# Patient Record
Sex: Female | Born: 1935 | Race: White | Hispanic: No | Marital: Married
Health system: Midwestern US, Academic
[De-identification: ages and names within clinical notes are randomized; demographics above are authoritative.]

---

## 2017-01-20 MED ORDER — VERAPAMIL 240 MG PO TBER
ORAL_TABLET | ORAL | 3 refills | 90.00000 days | Status: DC
Start: 2017-01-20 — End: 2017-01-20

## 2017-01-20 MED ORDER — VERAPAMIL 240 MG PO TBER
ORAL_TABLET | Freq: Two times a day (BID) | ORAL | 2 refills | 90.00000 days | Status: DC
Start: 2017-01-20 — End: 2017-11-23

## 2017-05-10 ENCOUNTER — Encounter: Admit: 2017-05-10 | Discharge: 2017-05-10 | Payer: MEDICARE

## 2017-05-10 DIAGNOSIS — E785 Hyperlipidemia, unspecified: ICD-10-CM

## 2017-05-10 DIAGNOSIS — I10 Essential (primary) hypertension: Principal | ICD-10-CM

## 2017-05-23 ENCOUNTER — Encounter: Admit: 2017-05-23 | Discharge: 2017-05-23 | Payer: MEDICARE

## 2017-05-23 DIAGNOSIS — I10 Essential (primary) hypertension: Principal | ICD-10-CM

## 2017-05-23 DIAGNOSIS — E785 Hyperlipidemia, unspecified: ICD-10-CM

## 2017-05-23 LAB — LIPID PROFILE
Lab: 200 — ABNORMAL LOW (ref 98–107)
Lab: 254 — ABNORMAL HIGH (ref 30–200)

## 2017-05-23 LAB — ALT (SGPT): Lab: 19

## 2017-05-23 LAB — BASIC METABOLIC PANEL: Lab: 130

## 2017-05-23 NOTE — Telephone Encounter
Left message with results and Dr. Wesley Blas recommendations.  Left callback number for any questions or concerns.  Pt is scheduled to see Dr. Arna Medici in [place] on 10/4.

## 2017-05-23 NOTE — Telephone Encounter
-----   Message from Hester Mates, MD sent at 05/23/2017 12:11 PM CDT -----  Franchot Mimes: Recent labs look good except for elevated triglycerides.  She should restrict her carbohydrates and increase her activity.  Please let her know.  Thanks. SBG  ----- Message -----  From: Rogelia Boga, RN  Sent: 05/23/2017  11:08 AM  To: Hester Mates, MD    Labs for your review, pt has a follow up appointment scheduled on 10/4 with you.  Please let the [facility] nursing know if you have any recommendations.  Thank you!

## 2017-05-23 NOTE — Telephone Encounter
-----   Message from Steven B Gollub, MD sent at 05/23/2017 12:11 PM CDT -----  Steve and Lisa: Recent labs look good except for elevated triglycerides.  She should restrict her carbohydrates and increase her activity.  Please let her know.  Thanks. SBG  ----- Message -----  From: Campbell, Jamie, RN  Sent: 05/23/2017  11:08 AM  To: Steven B Gollub, MD    Labs for your review, pt has a follow up appointment scheduled on 10/4 with you.  Please let the [facility] nursing know if you have any recommendations.  Thank you!

## 2017-05-26 ENCOUNTER — Ambulatory Visit: Admit: 2017-05-26 | Discharge: 2017-05-27 | Payer: MEDICARE

## 2017-05-26 ENCOUNTER — Encounter: Admit: 2017-05-26 | Discharge: 2017-05-26 | Payer: MEDICARE

## 2017-05-26 DIAGNOSIS — I10 Essential (primary) hypertension: Principal | ICD-10-CM

## 2017-05-26 DIAGNOSIS — E7800 Pure hypercholesterolemia, unspecified: ICD-10-CM

## 2017-05-26 DIAGNOSIS — R002 Palpitations: Principal | ICD-10-CM

## 2017-05-26 DIAGNOSIS — E785 Hyperlipidemia, unspecified: ICD-10-CM

## 2017-05-26 DIAGNOSIS — I471 Supraventricular tachycardia: ICD-10-CM

## 2017-05-26 NOTE — Progress Notes
Date of Service: 05/26/2017    Emily Frey is a 81 y.o. female.       HPI     Ms Emily Frey is followed for hypertension, supraventricular tachycardia and for hyperlipidemia.  She reports recurrent episode of sinusitis in March 2018 which responded to antibiotic therapy.  Ms. Emily Frey reports only 3 minor episodes of palpitations over the past year.  The longest episode lasted approximately 90 seconds.  He is mildly lightheaded at the time but not presyncopal or syncopal. Otherwise, the patient has been doing well and reports no angina, congestive symptoms, or syncope.  Her size, the patient is currently swimming for an hour 3 times a week.  The patient reports no myalgias, bleeding abnormalities, neurologic motor abnormalities or difficulty with speech. Ms. Emily Frey reports that her blood pressure has been very well controlled when she checks it outside of the office and she is presently taking her hydralazine 2 times a day.  Historically, Ms. Emily Frey has had some difficulty tolerating antihypertensive medications in the past. She had been on Toprol XL in the past which caused unacceptable fatigue and she had been on Avapro which made her arms feel cold. Micardis also made her feel cold and she reported that Cozaar gave her a swollen upper lip. Most recently she has been on a combination of hydralazine, verapamil, and triamterene with hydrochlorothiazide as a diuretic. She has tolerated her present antihypertensive regimen the best and it has been fairly effective in controlling her blood pressure.       Vitals:    05/26/17 1514 05/26/17 1529   BP: 136/72 144/68   Pulse: 57    Weight: 74.1 kg (163 lb 6.4 oz)    Height: 1.626 m (5' 4)      Body mass index is 28.05 kg/m???.     Past Medical History  Patient Active Problem List    Diagnosis Date Noted   ??? Hypertension, goal below 140/90 05/06/2009   ??? SVT (supraventricular tachycardia) (HCC) 05/06/2009   ??? Hyperlipidemia 05/06/2009         Review of Systems Constitution: Negative.   HENT: Positive for tinnitus.    Eyes: Negative.    Cardiovascular: Positive for irregular heartbeat.   Respiratory: Negative.    Endocrine: Negative.    Hematologic/Lymphatic: Negative.    Skin: Negative.    Musculoskeletal: Negative.    Gastrointestinal: Negative.    Genitourinary: Negative.    Neurological: Negative.    Psychiatric/Behavioral: Negative.    Allergic/Immunologic: Negative.        Physical Exam  GENERAL: The patient is well developed, well nourished, resting comfortably and in no distress.   HEENT: No abnormalities of the visible oro-nasopharynx, conjunctiva or sclera are noted.  NECK: There is no jugular venous distension. Carotids are palpable and without bruits. There is no thyroid enlargement.  Chest: Lung fields are clear to auscultation. There are no wheezes or crackles.  CV: There is a regular rhythm. The first and second heart sounds are normal. There are no murmurs, gallops or rubs. Her apical heart rate is 60 BPM.  ABD: The abdomen is soft and supple with normal bowel sounds. There is no hepatosplenomegaly, ascites, tenderness, masses or bruits.  Neuro: There are no focal motor defects. Ambulation is normal. Cognitive function appears normal.  Ext:???There is no edema or evidence of deep vein thrombosis. Peripheral pulses are satisfactory. ???  SKIN:???There are no rashes and no cellulitis  PSYCH:???The patient is calm, rationale and oriented  Cardiovascular Studies  Twelve-lead ECG was obtained on 05/26/2017 reveals normal sinus rhythm with a heart rate of 57 bpm.  There is no evidence of myocardial ischemia or infarction.    Labs from 05/23/2017 revealed total cholesterol 200, triglycerides 254, HDL 61 and LDL cholesterol 97 mg/dL.  Her ALT was 19.  Her serum creatinine was 0.72 mg/dL and serum potassium was 3.7 mmol/L.  Problems Addressed Today  Hypertension.  Palpitations. hypercholesterolemia.  Assessment and Plan Ms. Emily Frey???is doing well from a cardiovascular perspective. Ms. Emily Frey again declined the use of statin medication because she says that she is sensitive to the side effects of medications.  Yesterday increase her hydralazine to 25 mg 3 times daily.  I have asked the patient to keep a log book of her BP readings and to report systolic BP readings exceeding 140 mm Hg. Regular mild, aerobic exercise, weight loss and adherence to a heart healthy diet were recommended.  I have asked her to return for follow-up in 12 months.         Current Medications (including today's revisions)  ??? Aspirin 81 mg PO Tab Take 1 Tab by mouth Daily.   ??? CALCIUM CARBONATE/VITAMIN D3 (CALCIUM + D PO) Take 600 mg by mouth daily.   ??? fish oil /omega-3 fatty acids (SEA-OMEGA) 340/1000 mg PO Cap Take 1,000 mg by mouth daily.   ??? hydrALAZINE (APRESOLINE) 25 mg tablet TAKE 1 TAB BY MOUTH FOUR TIMES DAILY. (Patient taking differently: TAKE 1 TAB BY MOUTH TWO TIMES DAILY.)   ??? Magnesium 250 mg tab Take 1 tablet by mouth daily.   ??? Potassium 99 mg tab Take 1 tablet by mouth daily.   ??? PV W-O CAL/FERROUS FUMARATE/FA (M-VIT PO) Take 1 Tab by mouth Daily.   ??? triamterene-hydrochlorothiazide (DYAZIDE) 37.5-25 mg capsule TAKE ONE CAPSULE BY MOUTH IN THE MORNING   ??? verapamil  SR (CALAN-SR) 240 mg tablet Take one tablet twice daily.

## 2017-10-03 ENCOUNTER — Encounter: Admit: 2017-10-03 | Discharge: 2017-10-03 | Payer: MEDICARE

## 2017-10-03 MED ORDER — TRIAMTERENE-HYDROCHLOROTHIAZID 37.5-25 MG PO CAP
ORAL_CAPSULE | ORAL | 3 refills | Status: AC
Start: 2017-10-03 — End: 2019-01-17

## 2017-10-17 ENCOUNTER — Encounter: Admit: 2017-10-17 | Discharge: 2017-10-17 | Payer: MEDICARE

## 2017-10-17 MED ORDER — HYDRALAZINE 25 MG PO TAB
25 mg | ORAL_TABLET | Freq: Four times a day (QID) | ORAL | 2 refills | 30.00000 days | Status: AC
Start: 2017-10-17 — End: 2018-09-21

## 2017-10-24 LAB — BASIC METABOLIC PANEL
Lab: 0.7
Lab: 10
Lab: 10
Lab: 102 — ABNORMAL HIGH (ref 130–400)
Lab: 16 — ABNORMAL HIGH (ref 0–14)
Lab: 23
Lab: 83 — ABNORMAL LOW (ref 11.5–14.5)

## 2017-11-23 ENCOUNTER — Encounter: Admit: 2017-11-23 | Discharge: 2017-11-23 | Payer: MEDICARE

## 2017-11-23 MED ORDER — VERAPAMIL 240 MG PO TBER
ORAL_TABLET | Freq: Two times a day (BID) | ORAL | 2 refills | 90.00000 days | Status: AC
Start: 2017-11-23 — End: 2018-08-02

## 2018-08-02 ENCOUNTER — Encounter: Admit: 2018-08-02 | Discharge: 2018-08-02 | Payer: MEDICARE

## 2018-08-02 MED ORDER — VERAPAMIL 240 MG PO TBER
ORAL_TABLET | Freq: Two times a day (BID) | ORAL | 0 refills | 90.00000 days | Status: AC
Start: 2018-08-02 — End: 2018-10-30

## 2018-09-21 ENCOUNTER — Encounter: Admit: 2018-09-21 | Discharge: 2018-09-21 | Payer: MEDICARE

## 2018-09-21 ENCOUNTER — Ambulatory Visit: Admit: 2018-09-21 | Discharge: 2018-09-21 | Payer: MEDICARE

## 2018-09-21 DIAGNOSIS — I10 Essential (primary) hypertension: Secondary | ICD-10-CM

## 2018-09-21 DIAGNOSIS — I471 Supraventricular tachycardia: Secondary | ICD-10-CM

## 2018-09-21 DIAGNOSIS — E785 Hyperlipidemia, unspecified: Secondary | ICD-10-CM

## 2018-09-21 LAB — BASIC METABOLIC PANEL
Lab: 0.7
Lab: 13
Lab: 132 — ABNORMAL LOW (ref 136–145)
Lab: 14
Lab: 26
Lab: 4
Lab: 89
Lab: 9.3
Lab: 97 — ABNORMAL LOW (ref 98–107)

## 2018-09-21 MED ORDER — HYDRALAZINE 25 MG PO TAB
25 mg | ORAL_TABLET | Freq: Three times a day (TID) | ORAL | 3 refills | 42.50000 days | Status: AC
Start: 2018-09-21 — End: 2019-10-04

## 2018-09-22 ENCOUNTER — Encounter: Admit: 2018-09-22 | Discharge: 2018-09-22 | Payer: MEDICARE

## 2018-09-22 DIAGNOSIS — I10 Essential (primary) hypertension: Secondary | ICD-10-CM

## 2018-10-29 ENCOUNTER — Encounter: Admit: 2018-10-29 | Discharge: 2018-10-29 | Payer: MEDICARE

## 2018-10-30 MED ORDER — VERAPAMIL 240 MG PO TBER
ORAL_TABLET | Freq: Two times a day (BID) | ORAL | 3 refills | 90.00000 days | Status: AC
Start: 2018-10-30 — End: 2019-11-06

## 2019-01-17 ENCOUNTER — Encounter: Admit: 2019-01-17 | Discharge: 2019-01-17 | Payer: MEDICARE

## 2019-01-17 DIAGNOSIS — I10 Essential (primary) hypertension: Principal | ICD-10-CM

## 2019-01-17 MED ORDER — TRIAMTERENE-HYDROCHLOROTHIAZID 37.5-25 MG PO CAP
1 | ORAL_CAPSULE | Freq: Every morning | ORAL | 3 refills | Status: DC
Start: 2019-01-17 — End: 2020-01-01

## 2019-10-04 ENCOUNTER — Encounter: Admit: 2019-10-04 | Discharge: 2019-10-04 | Payer: MEDICARE

## 2019-10-04 DIAGNOSIS — I10 Essential (primary) hypertension: Secondary | ICD-10-CM

## 2019-10-04 MED ORDER — HYDRALAZINE 25 MG PO TAB
25 mg | ORAL_TABLET | Freq: Three times a day (TID) | ORAL | 0 refills | 30.00000 days | Status: DC
Start: 2019-10-04 — End: 2020-01-01

## 2019-10-04 NOTE — Telephone Encounter
Received a request via computer from the patients pharmacy requesting a refill. Script e-scribed as requested.      Overdue for office visit, will ask scheduling to call.

## 2019-11-06 ENCOUNTER — Encounter: Admit: 2019-11-06 | Discharge: 2019-11-06 | Payer: MEDICARE

## 2019-11-06 MED ORDER — VERAPAMIL 240 MG PO TBER
ORAL_TABLET | Freq: Two times a day (BID) | ORAL | 3 refills | 90.00000 days | Status: DC
Start: 2019-11-06 — End: 2020-01-01

## 2020-01-01 ENCOUNTER — Encounter: Admit: 2020-01-01 | Discharge: 2020-01-01 | Payer: MEDICARE

## 2020-01-01 DIAGNOSIS — E785 Hyperlipidemia, unspecified: Secondary | ICD-10-CM

## 2020-01-01 DIAGNOSIS — I10 Essential (primary) hypertension: Secondary | ICD-10-CM

## 2020-01-01 DIAGNOSIS — I471 Supraventricular tachycardia: Secondary | ICD-10-CM

## 2020-01-01 LAB — BASIC METABOLIC PANEL
Lab: 0.8 g/dL (ref 32.0–36.0)
Lab: 101 % — ABNORMAL LOW (ref 36–45)
Lab: 13 % — ABNORMAL LOW (ref 24–44)
Lab: 133 M/UL — ABNORMAL LOW (ref 136–145)
Lab: 18 pg (ref 26–34)
Lab: 23 FL (ref 80–100)
Lab: 4 g/dL — ABNORMAL LOW (ref 12.0–15.0)
Lab: 96 % — ABNORMAL HIGH (ref 11–15)

## 2020-01-01 MED ORDER — TRIAMTERENE-HYDROCHLOROTHIAZID 37.5-25 MG PO CAP
1 | ORAL_CAPSULE | Freq: Every morning | ORAL | 3 refills | Status: AC
Start: 2020-01-01 — End: ?

## 2020-01-01 MED ORDER — VERAPAMIL 240 MG PO TBER
240 mg | ORAL_TABLET | Freq: Two times a day (BID) | ORAL | 3 refills | 90.00000 days | Status: AC
Start: 2020-01-01 — End: ?

## 2020-01-01 MED ORDER — HYDRALAZINE 50 MG PO TAB
50 mg | ORAL_TABLET | Freq: Three times a day (TID) | ORAL | 3 refills | 42.50000 days | Status: AC
Start: 2020-01-01 — End: ?

## 2020-01-01 NOTE — Progress Notes
Date of Service: 01/01/2020    KEYLIANIS TAKARA is a 84 y.o. female.       HPI     Ms Jessel is followed for hypertension, supraventricular tachycardia and for hyperlipidemia.  She reports no febrile or infectious symptoms during the Covid pandemic?and has not yet received her Covid vaccines.  She had viral gastroenteritis early in 2020 which was not Covid.  The patient states that she developed lethargy in March 2019 and lab testing revealed mild hyponatremia.  Her triamterene was stopped for a period of time and she increased her salt intake and felt better.  She has since restarted her triamterene.  However, her systolic blood pressure still runs somewhat elevated when she checks it at home often with systolic blood pressures in the range of 140 to 150 mmHg.  She is polite but is adverse to lowering her blood pressure further because it makes her feel poorly. She is presently taking her hydralazine?25 mg 3?times a day.  Her palpitations have been under good control over the past year.  Otherwise, the patient has been doing well and reports no angina, congestive symptoms, palpitations, sensation of sustained forceful heart beating, lightheadedness or syncope.? She tries to walk at a slow pace for 20 minutes when the weather is nice and does stretching exercises.  Subjectively her biggest problem is chronic low back discomfort. ?The patient reports no myalgias, bleeding abnormalities, or strokelike symptoms historically, Ms. Baumel has had some difficulty tolerating antihypertensive medications in the past. She had been on Toprol XL in the past which caused unacceptable fatigue and she had been on Avapro which made her arms feel cold. Micardis also made her feel cold and she reported that Cozaar gave her a swollen upper lip. Most recently she has been on a combination of hydralazine, verapamil, and triamterene with hydrochlorothiazide as a diuretic. She has tolerated her present antihypertensive regimen the best. Vitals:    01/01/20 0825 01/01/20 0834   BP: (!) 158/74 (!) 156/66   BP Source: Arm, Left Upper Arm, Right Upper   Patient Position: Sitting Sitting   Pulse: 80    SpO2: 97%    Weight: 75.6 kg (166 lb 9.6 oz)    Height: 1.626 m (5' 4)    PainSc: Zero      Body mass index is 28.6 kg/m?Marland Kitchen     Past Medical History  Patient Active Problem List    Diagnosis Date Noted   ? Hypertension, goal below 140/90 05/06/2009   ? SVT (supraventricular tachycardia) (HCC) 05/06/2009   ? Hyperlipidemia 05/06/2009         Review of Systems   Constitution: Negative.   HENT: Positive for congestion and tinnitus.    Eyes: Negative.    Cardiovascular: Positive for palpitations.   Respiratory: Positive for cough and sputum production.    Endocrine: Negative.    Hematologic/Lymphatic: Bruises/bleeds easily.   Skin: Negative.    Musculoskeletal: Positive for back pain and joint pain.   Gastrointestinal: Negative.    Genitourinary: Negative.    Neurological: Positive for headaches and paresthesias.   Psychiatric/Behavioral: Negative.    Allergic/Immunologic: Negative.        Physical Exam  GENERAL: The patient is well developed, well nourished, resting comfortably and in no distress.   HEENT: No abnormalities of the visible oro-nasopharynx, conjunctiva or sclera are noted.  NECK: There is no jugular venous distension. Carotids are palpable and without bruits. There is no thyroid enlargement.  Chest: Lung fields are clear  to auscultation. There are no wheezes or crackles.  CV: There is a regular rhythm. The first and second heart sounds are normal. There are no murmurs, gallops or rubs. Her apical heart rate is?72?BPM.  ABD: The abdomen is soft and supple with normal bowel sounds. There is no hepatosplenomegaly, ascites, tenderness, masses or bruits.  Neuro: There are no focal motor defects. Ambulation is normal. Cognitive function appears normal.  Ext:?There is trace bipedal edema without evidence of deep vein thrombosis. Peripheral pulses are satisfactory. ?  SKIN:?There are no rashes and no cellulitis  PSYCH:?The patient is calm, rationale and oriented    Cardiovascular Studies  A twelve-lead ECG was obtained on 01/01/2020 reveals normal sinus rhythm with a heart rate of 72 bpm.  Incomplete right bundle branch block is noted.    Problems Addressed Today  Hypertension.  Hypercholesterolemia.  Supraventricular tachycardia.  Assessment and Plan     Ms. Pett reports that her palpitations are under satisfactory control.  She is very resistant to increasing her antihypertensive medications but after great discussion she was willing to increase her hydralazine to 50 mg 3 times daily. I have asked the patient to keep a log book of his BP readings and to report BP readings exceeding 130/80 mm Hg.  Regular mild aerobic exercise, weight loss and adherence to a heart healthy diet were recommended.  I offered to refer her to the spine center at [facility] but she declined.  I have asked her to return for follow-up in approximately 6 months time to review her blood pressure readings.         Current Medications (including today's revisions)  ? ascorbic acid (VITAMIN C) 500 mg tablet Take 500 mg by mouth daily.   ? Aspirin 81 mg PO Tab Take 1 Tab by mouth Daily.   ? CALCIUM CARBONATE/VITAMIN D3 (CALCIUM + D PO) Take 600 mg by mouth daily.   ? cholecalciferol (VITAMIN D) 1,000 units tablet Take 3,000 Units by mouth daily.   ? fish oil /omega-3 fatty acids (SEA-OMEGA) 340/1000 mg PO Cap Take 1,000 mg by mouth daily.   ? hydrALAZINE (APRESOLINE) 50 mg tablet Take one tablet by mouth three times daily.   ? Magnesium 250 mg tab Take 1 tablet by mouth daily.   ? Potassium 99 mg tab Take 1 tablet by mouth daily.   ? PV W-O CAL/FERROUS FUMARATE/FA (M-VIT PO) Take 1 Tab by mouth Daily.   ? triamterene-hydrochlorothiazide (DYAZIDE) 37.5-25 mg capsule Take one capsule by mouth every morning.   ? verapamil  SR (CALAN-SR) 240 mg tablet Take one tablet by mouth twice daily.   ? Zinc 50 mg tab Take 1 tablet by mouth daily.

## 2020-01-01 NOTE — Telephone Encounter
-----   Message from Hester Mates, MD sent at 01/01/2020  2:49 PM CDT -----  Labs look stable.  Please let her know.  Thanks.  SBG  ----- Message -----  From: Lauralee Evener, RN  Sent: 01/01/2020   2:47 PM CDT  To: Hester Mates, MD    Lab results for your review after OV today.

## 2020-01-01 NOTE — Telephone Encounter
Results and recommendations called to patient.

## 2020-01-01 NOTE — Patient Instructions
Increase hydralazine to 50mg  three times a day    Labs     Follow up as directed.  Call sooner if issues.  Call the [facility] nursing line at [phone].  Leave a detailed message for the nurse in [facility] with how we can assist you and we will call you back.

## 2020-01-24 IMAGING — MG MAMMOGRAM 3D SCREEN, BILATERAL
12 of 16 series · 12 of 16 positions shown · non-contrast
Comparison: none

[R CC (1 of 2)]
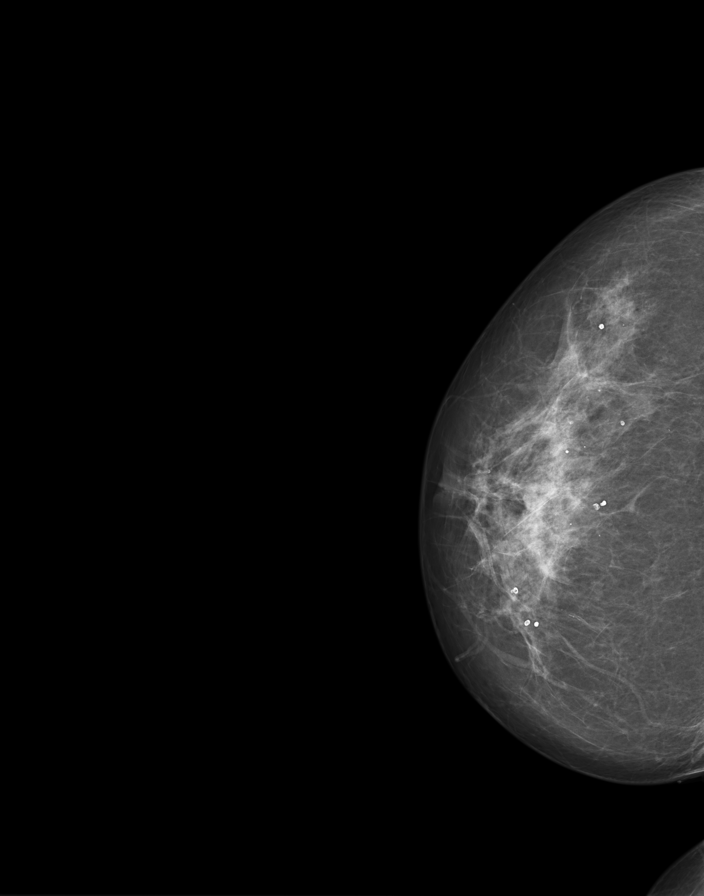

[R tomo (1 of 2)]
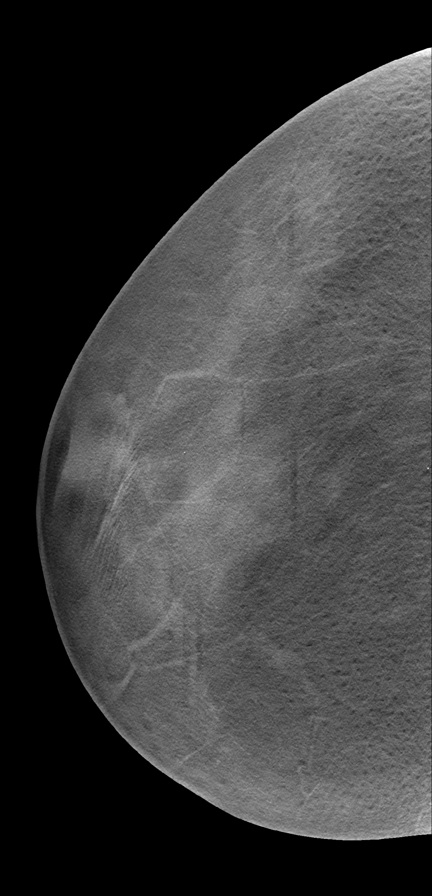

[R CC (2 of 2)]
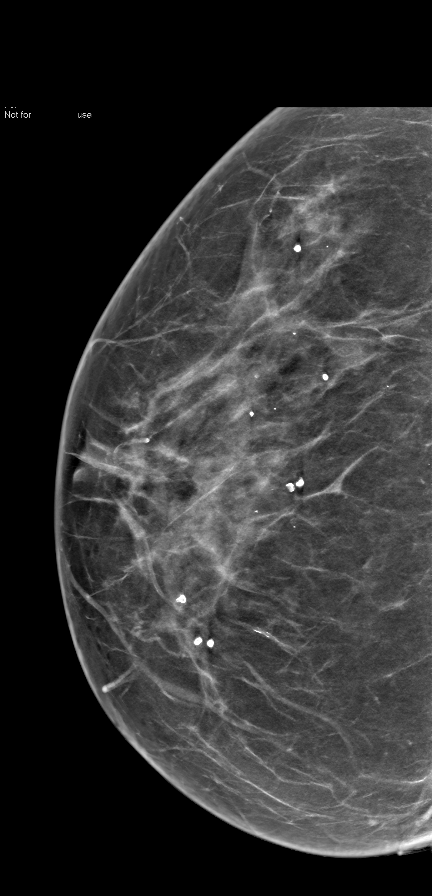

[R (1 of 2)]
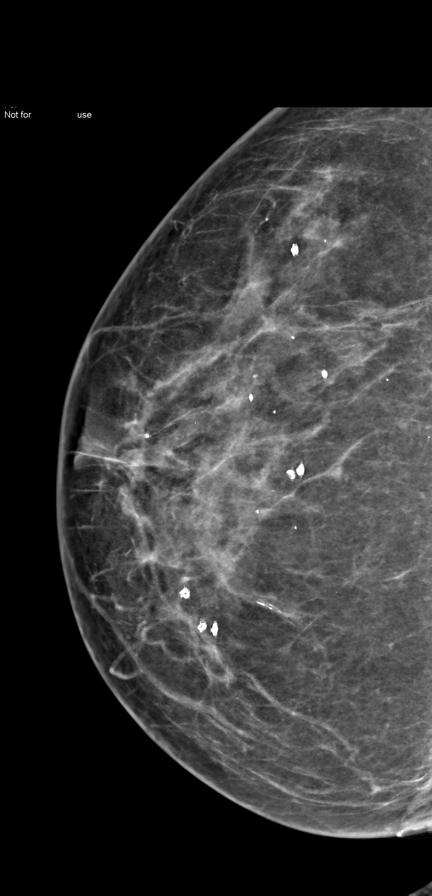

[L CC (1 of 2)]
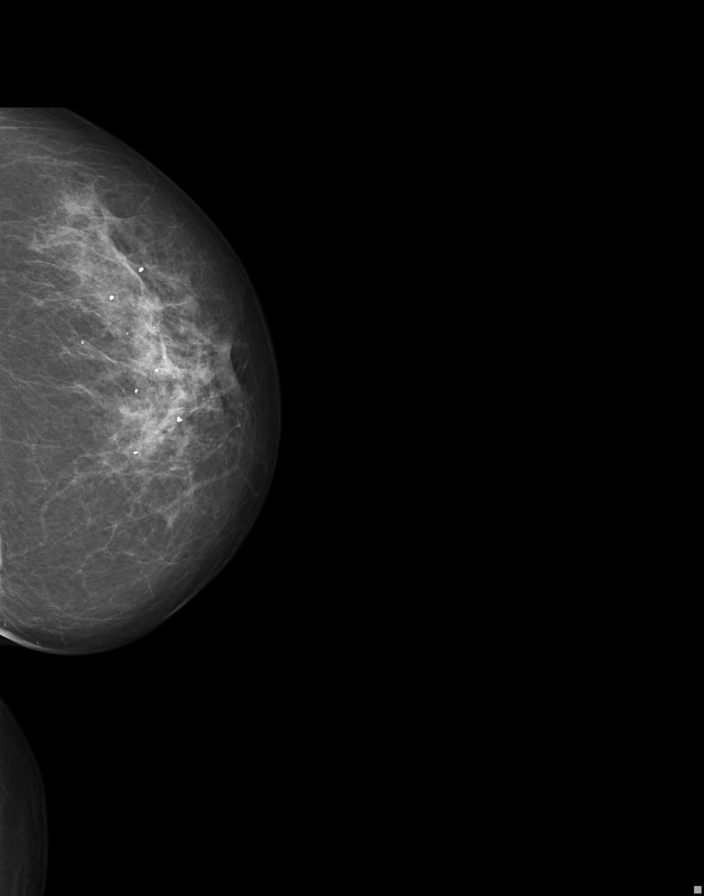

[L tomo]
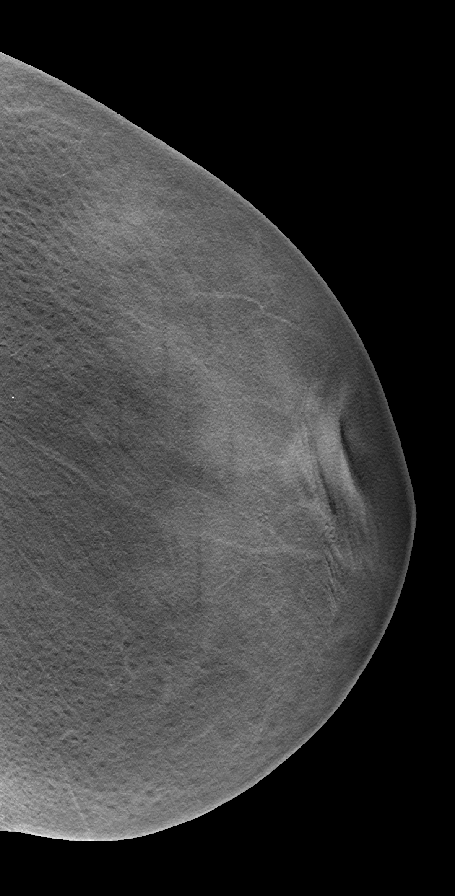

[L CC (2 of 2)]
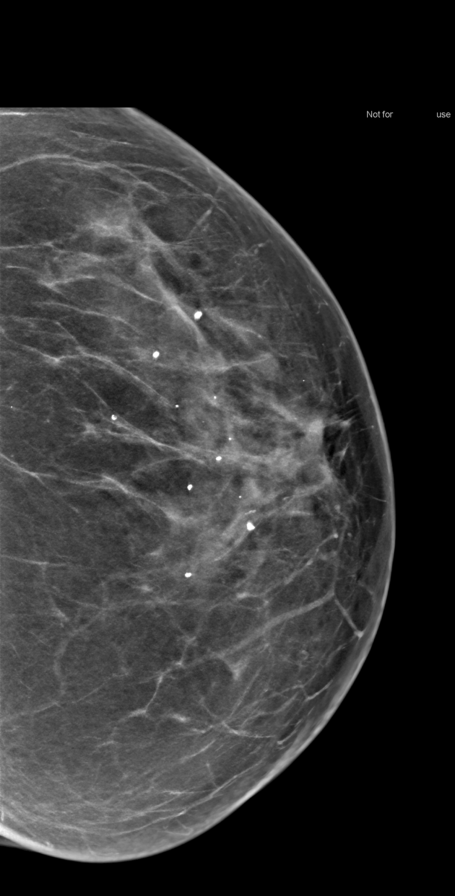

[L]
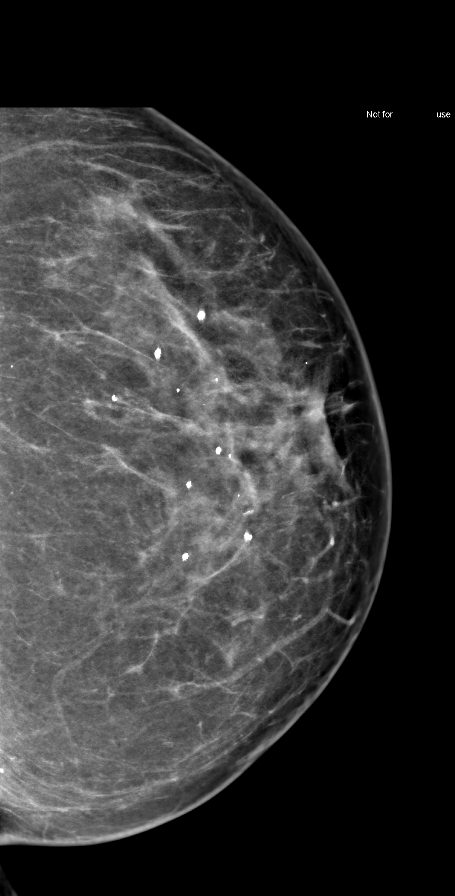

[R MLO (1 of 2)]
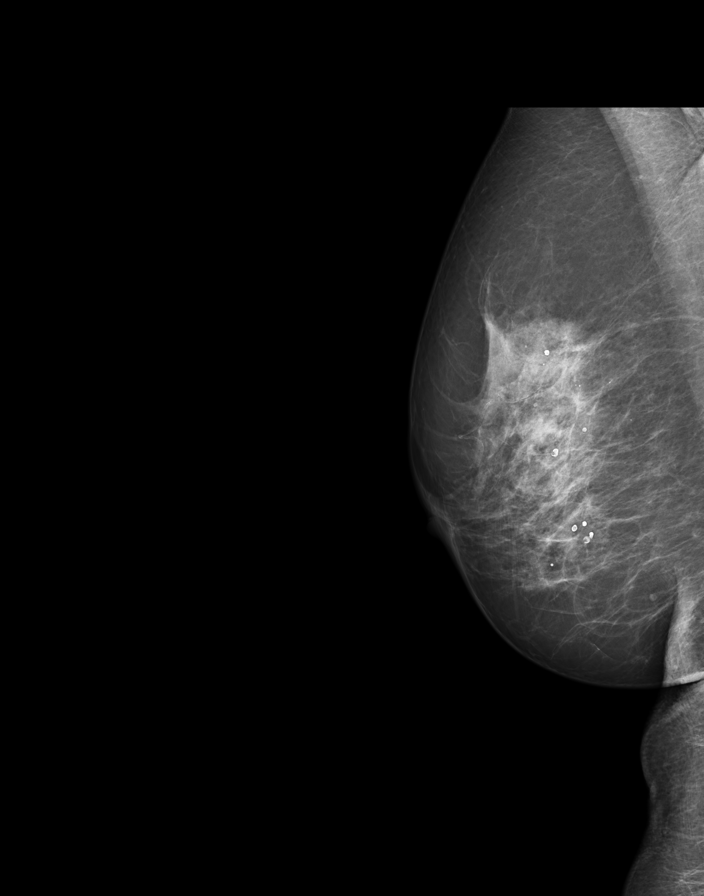

[R tomo (2 of 2)]
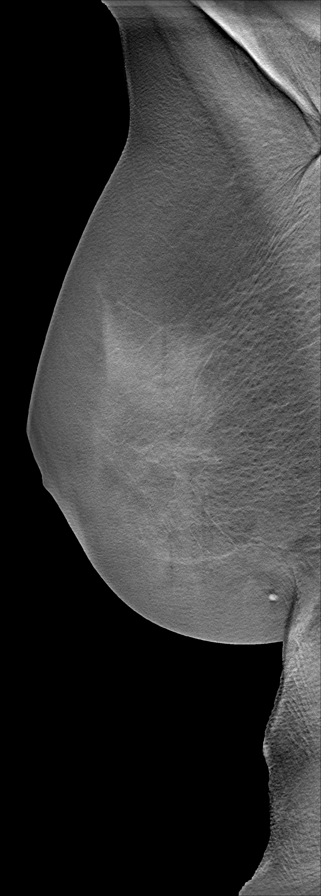

[R MLO (2 of 2)]
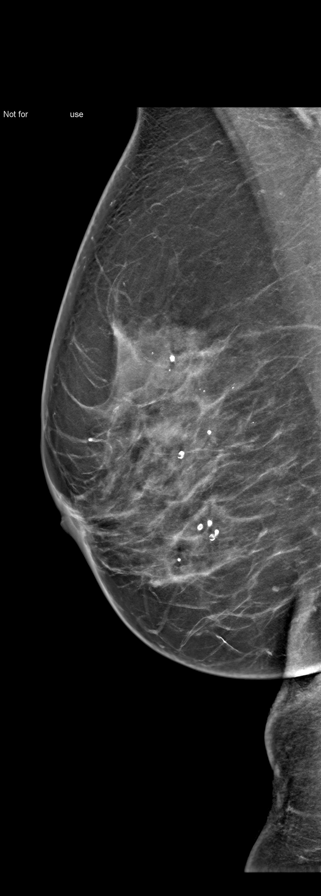

[R (2 of 2)]
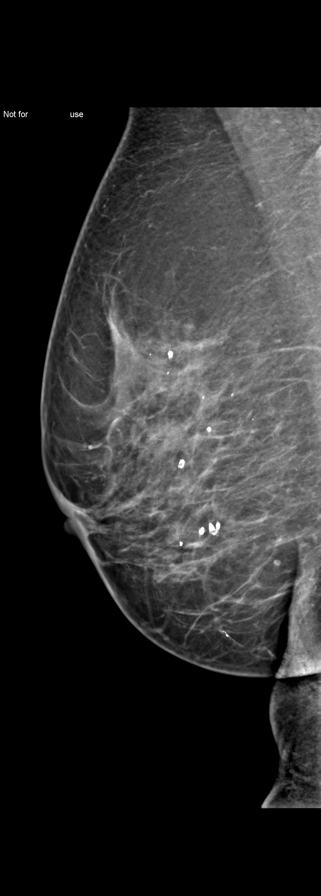

[12 of 16 positions shown; findings below may reference images not displayed]

EXAM
3D SCREENING MAMMOGRAM, BILATERAL

INDICATION
screening
SCREENING. AB (3D) PRIORS: 5852, 0962.

TECHNIQUE
Digital 2D CC and MLO projections obtained with 3D tomographic views per manufacturer's protocol.
ICAD version 7.2 was used during this exam.

COMPARISONS
Mammogram June 16, 2017

FINDINGS
The breasts demonstrate a heterogeneously dense pattern which somewhat limits the sensitivity of
mammography. There are scattered benign-appearing microcalcifications in the bilateral breasts.
There are persistent architectural changes within the upper outer left breast similar to previous
exam. There is persistent nipple inversion of the left nipple-areolar complex.

IMPRESSION
[BI-RADS 2, benign findings.
Yearly screening mammogram is recommended.

Tech Notes:

## 2020-05-20 ENCOUNTER — Encounter: Admit: 2020-05-20 | Discharge: 2020-05-20 | Payer: MEDICARE

## 2020-05-20 NOTE — Telephone Encounter
Pt called the nursing triage line to report that she has been having issues with her blood pressures.  Emily Frey states that her BP this morning was elevated at 180/82.  She then took her hydralazine and went for a 2 mile walk, and her BP is down to 160/64.  She states that she feels a buzzing in my head and a slight headache.  She has been taking Tylenol to treat the headache.  She states that in May, Dr. Arna Medici increased her dose of hydralazine and she has been having episodes where she feels breathlessness, not short of breath.  She denies any additional symptoms.    Emily Frey states that her brother passed away on May 30, 2023 and has noticed that her symptoms have worsened since then.  She does not have additional BP readings, but states that her BPs have been running 150-180s/60-80s.      Will route to Dr. Arna Medici for recommendations for patient.

## 2020-05-26 ENCOUNTER — Encounter: Admit: 2020-05-26 | Discharge: 2020-05-26 | Payer: MEDICARE

## 2020-05-26 DIAGNOSIS — E785 Hyperlipidemia, unspecified: Secondary | ICD-10-CM

## 2020-05-26 DIAGNOSIS — I10 Essential (primary) hypertension: Secondary | ICD-10-CM

## 2020-05-26 DIAGNOSIS — I471 Supraventricular tachycardia: Secondary | ICD-10-CM

## 2020-05-26 MED ORDER — CARVEDILOL 3.125 MG PO TAB
3.125 mg | ORAL_TABLET | Freq: Two times a day (BID) | ORAL | 1 refills | 90.00000 days | Status: AC
Start: 2020-05-26 — End: ?

## 2020-05-26 NOTE — Progress Notes
Date of Service: 05/26/2020    Emily Frey is a 84 y.o. female.       HPI     84 year old female with a known history of hypertension SVT and hyperlipidemia usually followed by Dr. Justice Frey presents for follow-up visit.    She was last seen by to follow-up on Jan 01, 2020.  Her blood pressure was elevated at her last office visit.  She has chronic lower back discomfort.    She has multiple side effects to antihypertensive medications and was previously reluctant to adjust her medications.    Blood pressure is significantly elevated in the office today.    At home blood pressure is in 170s.    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            Vitals:    05/26/20 1406 05/26/20 1420   BP: (!) 190/80 (!) 194/80   BP Source: Arm, Left Upper Arm, Right Upper   Patient Position: Sitting Sitting   Pulse: 80    SpO2: 97%    Weight: 74 kg (163 lb 3.2 oz)    Height: 1.626 m (5' 4)    PainSc: Zero      Body mass index is 28.01 kg/m?Marland Kitchen     Past Medical History  Patient Active Problem List    Diagnosis Date Noted   ? Hypertension, goal below 140/90 05/06/2009   ? SVT (supraventricular tachycardia) (HCC) 05/06/2009   ? Hyperlipidemia 05/06/2009         Review of Systems   Constitutional: Negative.   HENT: Negative.    Eyes: Negative.    Cardiovascular: Negative.    Respiratory: Negative.    Endocrine: Negative.    Hematologic/Lymphatic: Negative.    Skin: Negative.    Musculoskeletal: Negative.    Gastrointestinal: Negative.    Genitourinary: Negative.    Neurological: Positive for headaches.   Psychiatric/Behavioral: Negative.    Allergic/Immunologic: Negative.        Physical Exam  General Appearance: no acute distress  HEENT: EOMI, MM-moist, post OP-clear  Neck Veins: neck veins are flat & not distended  Carotid Arteries: no bruits  Chest Inspection: chest is normal in appearance  Auscultation/Percussion: lungs clear to auscultation, no rales, rhonchi, or wheezing  Cardiac Rhythm: regular rhythm & normal rate  Cardiac Auscultation: Normal S1 & S2, no S3 or S4, no rub  Murmurs: no cardiac murmurs  Extremities: no lower extremity edema; 2+ symmetric distal pulses  Skin: warm & intact  Neurologic Exam: oriented to time, place and person; no focal neurologic deficits      Cardiovascular Studies  As above    Cardiovascular Health Factors  Vitals BP Readings from Last 3 Encounters:   05/26/20 (!) 194/80   01/01/20 (!) 156/66   09/21/18 152/68     Wt Readings from Last 3 Encounters:   05/26/20 74 kg (163 lb 3.2 oz)   01/01/20 75.6 kg (166 lb 9.6 oz)   09/21/18 74.4 kg (164 lb)     BMI Readings from Last 3 Encounters:   05/26/20 28.01 kg/m?   01/01/20 28.60 kg/m?   09/21/18 27.29 kg/m?      Smoking Social History     Tobacco Use   Smoking Status Former Smoker   Smokeless Tobacco Never Used   Tobacco Comment    quit in the 60's      Lipid Profile Cholesterol   Date Value Ref Range Status   05/23/2017 200  Final  HDL   Date Value Ref Range Status   05/23/2017 61 (H) 35 - 60 Final     LDL   Date Value Ref Range Status   05/23/2017 97  Final     Triglycerides   Date Value Ref Range Status   05/23/2017 254 (H) 30 - 200 Final      Blood Sugar No results found for: HGBA1C  Glucose   Date Value Ref Range Status   01/01/2020 96  Final   09/21/2018 89  Final   10/24/2017 102  Final          Assessment and Plan  No diagnosis found.         Emily Frey has elevated blood pressures and a history of medication intolerance.     I am recommending adding a trial of beta-blocker to her regime.  She will start with Coreg 3.125 mg p.o. twice daily and monitor her blood pressure at home.  Continue hydralazine verapamil and hydrochlorothiazide.    Other options include adding oral nitrates or spironolactone.    I appreciate the opportunity of seeing Emily Frey in clinic today.She will follow up with her primary care physician and continue to follow with Emily Frey which is most convenient for her.           Current Medications (including today's revisions)  ? ascorbic acid (VITAMIN C) 500 mg tablet Take 500 mg by mouth daily.   ? Aspirin 81 mg PO Tab Take 1 Tab by mouth Daily.   ? CALCIUM CARBONATE/VITAMIN D3 (CALCIUM + D PO) Take 600 mg by mouth daily.   ? cholecalciferol (VITAMIN D) 1,000 units tablet Take 3,000 Units by mouth daily.   ? clonazePAM (KLONOPIN) 0.5 mg tablet Take 0.5 mg by mouth twice daily.   ? fish oil /omega-3 fatty acids (SEA-OMEGA) 340/1000 mg PO Cap Take 1,000 mg by mouth daily.   ? hydrALAZINE (APRESOLINE) 50 mg tablet Take one tablet by mouth three times daily.   ? hydroCHLOROthiazide (HYDRODIURIL) 25 mg tablet Take one tablet by mouth every morning.   ? Magnesium 250 mg tab Take 1 tablet by mouth daily.   ? Potassium 99 mg tab Take 1 tablet by mouth daily.   ? PV W-O CAL/FERROUS FUMARATE/FA (M-VIT PO) Take 1 Tab by mouth Daily.   ? spironolactone (ALDACTONE) 25 mg tablet Take one-half tablet by mouth daily. Take with food.   ? verapamil  SR (CALAN-SR) 240 mg tablet Take one tablet by mouth twice daily.   ? Zinc 50 mg tab Take 1 tablet by mouth daily.

## 2020-05-26 NOTE — Patient Instructions
Trial of Coreg 3.125mg  po BID    See PCP in interim and Dr Justice Britain on Nov 9th

## 2020-05-27 ENCOUNTER — Encounter: Admit: 2020-05-27 | Discharge: 2020-05-27 | Payer: MEDICARE

## 2020-05-27 DIAGNOSIS — I10 Essential (primary) hypertension: Secondary | ICD-10-CM

## 2020-05-27 NOTE — Telephone Encounter
Patient called inquiring if she should have labs drawn to check her electrolytes and kidney function since she has had some blood pressure medication changes lately. On 09/28 triamterene was stopped, HCTZ continued and 12.5 mg Spironolactone added. Patient then saw Dr. Gwen Pounds in the office yesterday on 10/4 and Coreg 3.125 mg twice daily added. She continues to take hydralazine 50 mg three times a day and Verapamil 240 mg twice daily. Patient states she is not having any symptoms but is concerned she should have labs with all the medication changes.    Will route to Dr. Arna Medici for his review and recommendations.

## 2020-05-27 NOTE — Telephone Encounter
Hester Mates, MD  P [facility] Nurse [place];    Please obtain Chem-7 prior to next office visit. ?Thanks. ?SBG

## 2020-05-27 NOTE — Telephone Encounter
Patient notified, order placed and sent to [facility] per patient request.

## 2020-06-04 ENCOUNTER — Encounter: Admit: 2020-06-04 | Discharge: 2020-06-04 | Payer: MEDICARE

## 2020-06-04 DIAGNOSIS — I10 Essential (primary) hypertension: Secondary | ICD-10-CM

## 2020-06-04 LAB — BASIC METABOLIC PANEL
Lab: 0.7
Lab: 10
Lab: 101
Lab: 127 — ABNORMAL LOW (ref 136–145)
Lab: 22 — ABNORMAL LOW (ref 23–31)
Lab: 4.1
Lab: 9.5
Lab: 94 — ABNORMAL LOW (ref 98–107)

## 2020-06-05 ENCOUNTER — Encounter: Admit: 2020-06-05 | Discharge: 2020-06-05 | Payer: MEDICARE

## 2020-06-05 DIAGNOSIS — E871 Hypo-osmolality and hyponatremia: Secondary | ICD-10-CM

## 2020-06-05 NOTE — Telephone Encounter
06/05/2020 4:36 PM Patient of Dr. Arna Medici called about BMP lab results, completed on 06/04/20.  Na+ is 127; Cl 94.    On 09/28, triamterene was stopped; HCTZ 25 mg daily continued; and 12.5 mg Spironolactone added, per Dr. Arna Medici. Patient then saw Dr. Gwen Pounds in the office on 10/4, and Coreg 3.125 mg twice daily added. She continues to take hydralazine 50 mg three times a day and Verapamil 240 mg twice daily.    Patient reports home BPs in the 140 range.  Per patient, Dr. Arna Medici told her SPB of 140 is her goal.    06/05/20 144/68, 70  After 30-minute-afternoon walk. (Did not rest for 10 minutes prior.)    Patient states she feels fine.  She is trying to walk 30 minutes every day.  Patient states she does feel a little tired, but is able to complete all of her activities.     Patient reports she drinks 2500 CCs of water per day.  Denies any swelling.      Asked patient to reduce her water intake to 2000 CCs and recheck BMP prior to her 07/01/20 OV with Dr. Arna Medici.  Patient is agreeable with this plan.    Discussed all above with Dr. Avie Arenas, in clinic.  She concurs with plan to reduce water intake and recheck BMP prior to next OV.     Routing this note to Dr Arna Medici for awareness and further recommendations, as appropriate.

## 2020-06-16 ENCOUNTER — Encounter: Admit: 2020-06-16 | Discharge: 2020-06-16 | Payer: MEDICARE

## 2020-06-16 NOTE — Telephone Encounter
Discussed recommendations from Dr. Arna Medici with the patient. The patient verbalizes understanding and agrees with the plan of care. Patient already has a scheduled appointment and will keep that appointment.

## 2020-06-16 NOTE — Telephone Encounter
Hester Mates, MD  Lauralee Evener, RN    Emilyn Ruble: I am concerned that she will have difficulty controlling her blood pressure without hydralazine. ?I would recommend scheduling her for an office visit to discuss her antihypertensive regimen. ?Thanks. ?SBG

## 2020-06-16 NOTE — Telephone Encounter
Patient is requesting to decrease hydralazine. Patient states she feels breathless. She has noticed that she takes short quick breaths. She also has buzzing in her head constantly today. Patient states her bp last night was elevated at 170/78, this morning it was 142/64 and at the time of the call 170/82. She reports she had not taken her daily dose of  HCTZ or spironolactone. Advised patient to take these two meds as they help with bp. Patient reports her bp has been running 140's/60's and  occassionally has higher readings similar to today's. Patient denies any other symptoms.     Patient currently is taking hydralazine 50 mg three times daily, coreg 3.125 mg twice daily, spironolactone 12.5 mg daily, HCTZ 25 mg daily and verapamil 240 mg twice daily.     Will route to Dr. Arna Medici for his review and recommendations.

## 2020-06-20 ENCOUNTER — Encounter: Admit: 2020-06-20 | Discharge: 2020-06-20 | Payer: MEDICARE

## 2020-06-20 MED ORDER — HYDRALAZINE 50 MG PO TAB
100 mg | ORAL_TABLET | Freq: Three times a day (TID) | ORAL | 3 refills | 30.00000 days | Status: AC
Start: 2020-06-20 — End: ?

## 2020-06-20 NOTE — Telephone Encounter
Discussed in clinic with JST by Waneta Martins. RN.  Her recommendations are to stop the coreg and increase the Hydralazine to 100mg  TID. No recommendations to stop spironolactone or HCTZ.

## 2020-06-20 NOTE — Telephone Encounter
Pt is agreeable to Dr. Darcus Austin recommendations, but is apprehensive.  She would still like to get Dr. Wesley Blas recommendations when he is available.  Updated medication list.    Will route to Dr. Arna Medici for any additional recommendations.

## 2020-07-01 ENCOUNTER — Encounter: Admit: 2020-07-01 | Discharge: 2020-07-01 | Payer: MEDICARE

## 2020-07-01 DIAGNOSIS — I10 Essential (primary) hypertension: Secondary | ICD-10-CM

## 2020-07-01 DIAGNOSIS — E785 Hyperlipidemia, unspecified: Secondary | ICD-10-CM

## 2020-07-01 DIAGNOSIS — I471 Supraventricular tachycardia: Secondary | ICD-10-CM

## 2020-07-01 NOTE — Progress Notes
Date of Service: 07/01/2020    Emily Frey is a 84 y.o. female.       HPI     Emily Frey is followed for hypertension, supraventricular tachycardia and for hyperlipidemia.  She reports no febrile or infectious symptoms during the Covid pandemic?and has not yet received her Covid vaccines. Emily. Frey was recently hospitalized from 06/20/2020 until 06/21/2020 for elevated blood pressure, fatigue and mild confusion.  She has had difficult to control hypertension and recently was noted to have hyponatremia with a serum sodium of 117 mEq/L which improved with discontinuation of her diuretic therapy.  She reports that her last serum sodium was up to 130 mEq/L.  Her strong preference is to continue hydralazine 50 mg 3 times daily along with verapamil 240 mg SR twice daily.  She realizes that her blood pressures usually run 140-145 mm Hg systolic with a diastolic blood pressure of 60 mmHg.  She is not interested in any additional antihypertensive medication at this time.  She recently tried taking Benadryl prior to sleep and thought that this led to a minor flare in her palpitations.  Generally,?her palpitations have been under good control over the past year. ?Otherwise, the patient has been doing well and reports no angina, congestive symptoms,?palpitations, sensation of sustained forceful heart beating, lightheadedness?or syncope.? She tries to walk at a slow pace for 10 minutes twice daily when the weather is nice and does stretching exercises.  Subjectively her biggest problem is chronic low back discomfort. ?The patient reports no myalgias, bleeding abnormalities, or strokelike symptoms historically, Emily. Frey has had some difficulty tolerating antihypertensive medications in the past. She had been on Toprol XL in the past which caused unacceptable fatigue and she had been on Avapro which made her arms feel cold. Micardis also made her feel cold and she reported that Cozaar gave her a swollen upper lip. She had viral gastroenteritis early in 2020 which was not Covid.       Vitals:    07/01/20 0839 07/01/20 0847   BP: (!) 144/54 (!) 148/52   BP Source: Arm, Left Upper Arm, Right Upper   Patient Position: Sitting Sitting   Pulse: 82    SpO2: 97%    Weight: 70.1 kg (154 lb 9.6 oz)    Height: 1.626 m (5' 4)    PainSc: Zero      Body mass index is 26.54 kg/m?Marland Kitchen     Past Medical History  Patient Active Problem List    Diagnosis Date Noted   ? Hypertension, goal below 140/90 05/06/2009   ? SVT (supraventricular tachycardia) (HCC) 05/06/2009   ? Hyperlipidemia 05/06/2009         Review of Systems   Constitutional: Negative.   HENT: Negative.    Eyes: Negative.    Cardiovascular: Positive for dyspnea on exertion and irregular heartbeat.   Respiratory: Negative.    Endocrine: Negative.    Hematologic/Lymphatic: Negative.    Skin: Negative.    Musculoskeletal: Positive for muscle weakness and myalgias.   Gastrointestinal: Negative.    Genitourinary: Negative.    Neurological: Negative.    Psychiatric/Behavioral: The patient has insomnia and is nervous/anxious.    Allergic/Immunologic: Negative.        Physical Exam  GENERAL: The patient is well developed, well nourished, resting comfortably and in no distress.   HEENT: No abnormalities of the visible oro-nasopharynx, conjunctiva or sclera are noted.  NECK: There is no jugular venous distension. Carotids are palpable and without bruits. There  is no thyroid enlargement.  Chest: Lung fields are clear to auscultation. There are no wheezes or crackles.  CV: There is a regular rhythm. The first and second heart sounds are normal. There are no murmurs, gallops or rubs. Her apical heart rate is?72?BPM.  ABD: The abdomen is soft and supple with normal bowel sounds. There is no hepatosplenomegaly, ascites, tenderness, masses or bruits.  Neuro: There are no focal motor defects. Ambulation is normal. Cognitive function appears normal.  Ext:?There is?trace bipedal?edema?without?evidence of deep vein thrombosis. Peripheral pulses are satisfactory. ?  SKIN:?There are no rashes and no cellulitis  PSYCH:?The patient is calm, rationale and oriented    Cardiovascular Studies  A twelve-lead ECG was obtained on 01/01/2020 reveals normal sinus rhythm with a heart rate of 72 bpm.  Incomplete right bundle branch block is noted.  An outside ECG from 06/20/2020 reveals normal sinus rhythm with a heart rate of 76 bpm.  Incomplete right bundle branch block is noted along with left atrial enlargement.  Cardiovascular Health Factors  Vitals BP Readings from Last 3 Encounters:   07/01/20 (!) 148/52   05/26/20 (!) 194/80   01/01/20 (!) 156/66     Wt Readings from Last 3 Encounters:   07/01/20 70.1 kg (154 lb 9.6 oz)   05/26/20 74 kg (163 lb 3.2 oz)   01/01/20 75.6 kg (166 lb 9.6 oz)     BMI Readings from Last 3 Encounters:   07/01/20 26.54 kg/m?   05/26/20 28.01 kg/m?   01/01/20 28.60 kg/m?      Smoking Social History     Tobacco Use   Smoking Status Former Smoker   Smokeless Tobacco Never Used   Tobacco Comment    quit in the 60's      Lipid Profile Cholesterol   Date Value Ref Range Status   05/23/2017 200  Final     HDL   Date Value Ref Range Status   05/23/2017 61 (H) 35 - 60 Final     LDL   Date Value Ref Range Status   05/23/2017 97  Final     Triglycerides   Date Value Ref Range Status   05/23/2017 254 (H) 30 - 200 Final      Blood Sugar No results found for: HGBA1C  Glucose   Date Value Ref Range Status   06/04/2020 101  Final   01/01/2020 96  Final   09/21/2018 89  Final          Problems Addressed Today  Encounter Diagnoses   Name Primary?   ? Hyperlipidemia, unspecified hyperlipidemia type    ? Hypertension, goal below 140/90    ? SVT (supraventricular tachycardia) (HCC)        Assessment and Plan     Emily. Frey is content with her current antihypertensive regimen.  She wants her primary care physician to adjust her antihypertensive medications and only wants to return to cardiology as necessary.         Current Medications (including today's revisions)  ? ascorbic acid (VITAMIN C) 500 mg tablet Take 500 mg by mouth daily.   ? Aspirin 81 mg PO Tab Take 1 Tab by mouth Daily.   ? CALCIUM CARBONATE/VITAMIN D3 (CALCIUM + D PO) Take 600 mg by mouth daily.   ? cholecalciferol (VITAMIN D) 1,000 units tablet Take 3,000 Units by mouth daily.   ? fish oil /omega-3 fatty acids (SEA-OMEGA) 340/1000 mg PO Cap Take 1,000 mg by mouth daily.   ? hydrALAZINE (  APRESOLINE) 50 mg tablet Take two tablets by mouth three times daily. (Patient taking differently: Take 50 mg by mouth three times daily.)   ? Magnesium 250 mg tab Take 1 tablet by mouth daily.   ? Potassium 99 mg tab Take 1 tablet by mouth daily.   ? PV W-O CAL/FERROUS FUMARATE/FA (M-VIT PO) Take 1 Tab by mouth Daily.   ? verapamil  SR (CALAN-SR) 240 mg tablet Take one tablet by mouth twice daily.   ? Zinc 50 mg tab Take 1 tablet by mouth daily.

## 2021-01-31 ENCOUNTER — Encounter: Admit: 2021-01-31 | Discharge: 2021-01-31 | Payer: MEDICARE

## 2021-01-31 MED ORDER — VERAPAMIL 240 MG PO TBER
ORAL_TABLET | Freq: Two times a day (BID) | 3 refills
Start: 2021-01-31 — End: ?
